# Patient Record
Sex: Male | Born: 1980 | Race: White | Hispanic: Yes | Marital: Single | Smoking: Current every day smoker
Health system: Southern US, Community
[De-identification: ages and names within clinical notes are randomized; demographics above are authoritative.]

## PROBLEM LIST (undated history)

## (undated) HISTORY — PX: ROTATOR CUFF REPAIR: SHX139

---

## 2004-05-11 ENCOUNTER — Emergency Department (HOSPITAL_COMMUNITY): Admission: EM | Admit: 2004-05-11 | Discharge: 2004-05-11 | Payer: Self-pay | Admitting: Emergency Medicine

## 2005-08-14 ENCOUNTER — Emergency Department (HOSPITAL_COMMUNITY): Admission: EM | Admit: 2005-08-14 | Discharge: 2005-08-14 | Payer: Self-pay | Admitting: Emergency Medicine

## 2005-12-25 ENCOUNTER — Emergency Department (HOSPITAL_COMMUNITY): Admission: EM | Admit: 2005-12-25 | Discharge: 2005-12-25 | Payer: Self-pay | Admitting: Emergency Medicine

## 2007-05-30 ENCOUNTER — Emergency Department (HOSPITAL_COMMUNITY): Admission: EM | Admit: 2007-05-30 | Discharge: 2007-05-30 | Payer: Self-pay | Admitting: Emergency Medicine

## 2007-06-12 ENCOUNTER — Ambulatory Visit (HOSPITAL_COMMUNITY): Admission: RE | Admit: 2007-06-12 | Discharge: 2007-06-12 | Payer: Self-pay | Admitting: *Deleted

## 2007-06-12 ENCOUNTER — Encounter (INDEPENDENT_AMBULATORY_CARE_PROVIDER_SITE_OTHER): Payer: Self-pay | Admitting: *Deleted

## 2010-10-02 NOTE — Op Note (Signed)
NAMESHERWOOD, CASTILLA       ACCOUNT NO.:  [id number]   MEDICAL RECORD NO.:  [id number]          PATIENT TYPE:  AMB   LOCATION:  ENDO                         FACILITY:  [facility]   PHYSICIAN:  Georgiana Spinner, M.D.    DATE OF BIRTH:  [birth year]   DATE OF PROCEDURE:  06/12/2007  DATE OF DISCHARGE:                               OPERATIVE REPORT   PROCEDURE:  Upper endoscopy.   INDICATIONS:  Abdominal pain, hematemesis, and weight loss.   ANESTHESIA:  Fentanyl 100 mcg, Versed 7 mg.   PROCEDURE:  With the patient mildly sedated in the left lateral  decubitus position the Pentax videoscopic endoscope was inserted in the  mouth, passed under direct vision through the esophagus which appeared  normal until we reached the distal esophagus and there were changes of  esophagitis and/or Barrett's.  Photographs and biopsies taken.  We  entered into the stomach.  Fundus, body, antrum appeared normal.  Duodenal bulb showed an ulcer. The second portion of the duodenum  appeared normal.   From this point the endoscope was slowly withdrawn taking  circumferential views of duodenal mucosa until the endoscope had been  pulled back into stomach, placed in retroflexion to view the stomach  from below.  The endoscope was straightened and withdrawn taking  circumferential views of the remaining gastric and esophageal mucosa.  The patient's vital signs and pulse oximeter remained stable.  The  patient tolerated the procedure well without apparent complications.   FINDINGS:  Ulcer of duodenum, small, with white base.  Biopsies taken  for H pylori from the antrum which appeared normal and esophagitis  and/or Barrett's esophagus.  Await biopsy reports.  The patient will  call me for results and follow-up with me as an outpatient.  We will  start the patient on b.i.d. PPI until seen in the office. We will also  suggest strongly the patient decrease his alcohol intake, ideally to  stop, but to limit it to  no more than two a day if possible.           ______________________________  Georgiana Spinner, M.D.     GMO/MEDQ  D:  06/12/2007  T:  06/12/2007  Job:  [id number]

## 2011-02-07 LAB — COMPREHENSIVE METABOLIC PANEL
ALT: 34
AST: 35
Albumin: 4.1
Alkaline Phosphatase: 64
BUN: 5 — ABNORMAL LOW
CO2: 24
Calcium: 8.8
Chloride: 110
Creatinine, Ser: 0.82
GFR calc Af Amer: >60
GFR calc non Af Amer: >60
Glucose, Bld: 87
Potassium: 4
Sodium: 144
Total Bilirubin: 0.4
Total Protein: 6.7

## 2011-02-07 LAB — URINALYSIS, ROUTINE W REFLEX MICROSCOPIC
Bilirubin Urine: NEGATIVE
Glucose, UA: NEGATIVE
Hgb urine dipstick: NEGATIVE
Ketones, ur: NEGATIVE
Nitrite: NEGATIVE
Protein, ur: NEGATIVE
Specific Gravity, Urine: 1.006
Urobilinogen, UA: 0.2
pH: 7

## 2011-02-07 LAB — DIFFERENTIAL
Basophils Absolute: 0
Basophils Relative: 0
Eosinophils Absolute: 0.1
Eosinophils Relative: 1
Lymphocytes Relative: 14
Lymphs Abs: 1.1
Monocytes Absolute: 0.7
Monocytes Relative: 8
Neutro Abs: 6.5
Neutrophils Relative %: 78 — ABNORMAL HIGH

## 2011-02-07 LAB — CBC
HCT: 44.9
Hemoglobin: 15.6
MCHC: 34.7
MCV: 91.2
Platelets: 301
RBC: 4.93
RDW: 12.3
WBC: 8.4

## 2011-02-07 LAB — LIPASE, BLOOD: Lipase: 27

## 2011-02-07 LAB — ETHANOL: Alcohol, Ethyl (B): 162 — ABNORMAL HIGH

## 2012-06-17 ENCOUNTER — Emergency Department (HOSPITAL_COMMUNITY): Payer: Self-pay

## 2012-06-17 ENCOUNTER — Emergency Department (HOSPITAL_COMMUNITY)
Admission: EM | Admit: 2012-06-17 | Discharge: 2012-06-17 | Disposition: A | Discharge status: Home / Self Care | Payer: Self-pay | Attending: Emergency Medicine | Admitting: Emergency Medicine

## 2012-06-17 ENCOUNTER — Encounter (HOSPITAL_COMMUNITY): Payer: Self-pay

## 2012-06-17 DIAGNOSIS — S6990XA Unspecified injury of unspecified wrist, hand and finger(s), initial encounter: Secondary | ICD-10-CM | POA: Insufficient documentation

## 2012-06-17 DIAGNOSIS — Y9289 Other specified places as the place of occurrence of the external cause: Secondary | ICD-10-CM | POA: Insufficient documentation

## 2012-06-17 DIAGNOSIS — M549 Dorsalgia, unspecified: Secondary | ICD-10-CM

## 2012-06-17 DIAGNOSIS — S8990XA Unspecified injury of unspecified lower leg, initial encounter: Secondary | ICD-10-CM | POA: Insufficient documentation

## 2012-06-17 DIAGNOSIS — Y9301 Activity, walking, marching and hiking: Secondary | ICD-10-CM | POA: Insufficient documentation

## 2012-06-17 DIAGNOSIS — R109 Unspecified abdominal pain: Secondary | ICD-10-CM

## 2012-06-17 DIAGNOSIS — IMO0002 Reserved for concepts with insufficient information to code with codable children: Secondary | ICD-10-CM | POA: Insufficient documentation

## 2012-06-17 DIAGNOSIS — S99929A Unspecified injury of unspecified foot, initial encounter: Secondary | ICD-10-CM | POA: Insufficient documentation

## 2012-06-17 DIAGNOSIS — S3981XA Other specified injuries of abdomen, initial encounter: Secondary | ICD-10-CM | POA: Insufficient documentation

## 2012-06-17 DIAGNOSIS — F17200 Nicotine dependence, unspecified, uncomplicated: Secondary | ICD-10-CM | POA: Insufficient documentation

## 2012-06-17 DIAGNOSIS — M79676 Pain in unspecified toe(s): Secondary | ICD-10-CM

## 2012-06-17 MED ORDER — HYDROCODONE-ACETAMINOPHEN 5-325 MG PO TABS: 1.0000 | ORAL_TABLET | Freq: Four times a day (QID) | ORAL | Status: DC | PRN

## 2012-06-17 MED ORDER — IOHEXOL 300 MG/ML  SOLN
100.0000 mL | Freq: Once | INTRAMUSCULAR | Status: AC | PRN
  Administered 2012-06-17: 100 mL via INTRAVENOUS

## 2012-06-17 MED ORDER — OXYCODONE-ACETAMINOPHEN 5-325 MG PO TABS
2.0000 | ORAL_TABLET | Freq: Once | ORAL | Status: AC
  Administered 2012-06-17: 2 via ORAL
  Filled 2012-06-17: qty 2

## 2012-06-17 MED ORDER — CYCLOBENZAPRINE HCL 10 MG PO TABS: 10.0000 mg | ORAL_TABLET | Freq: Two times a day (BID) | ORAL | Status: DC | PRN
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
## 2012-06-17 NOTE — ED Notes (Signed)
Pt discharge.Vital signs stable and GCS 15. 

## 2012-06-17 NOTE — ED Provider Notes (Signed)
Medical screening examination/treatment/procedure(s) were performed by non-physician practitioner and as supervising physician I was immediately available for consultation/collaboration.   Truly Stankiewicz, MD 06/17/12 2326 

## 2012-06-17 NOTE — ED Notes (Signed)
Report received from EMS. Unable to locate pt in the lobby to complete triage.

## 2012-06-17 NOTE — ED Notes (Signed)
PA stated to change acuity from a 4 to a 3.

## 2012-06-17 NOTE — ED Notes (Signed)
Patient left ribs pain achy sore 8/10 airway intact bilateral equal chest rise and fall even unlabored with right hand pain +1 swelling radial pulse +2 full sensation cap refill less then 2 seconds.  Right greater toe pain 8/10 achy throbbing light blue ecchymosis. Cap refill less then 2 seconds.

## 2012-12-03 ENCOUNTER — Encounter (HOSPITAL_COMMUNITY): Payer: Self-pay | Admitting: Emergency Medicine

## 2012-12-03 ENCOUNTER — Emergency Department (HOSPITAL_COMMUNITY)
Admission: EM | Admit: 2012-12-03 | Discharge: 2012-12-03 | Disposition: A | Discharge status: Home / Self Care | Payer: BC Managed Care – PPO | Attending: Emergency Medicine | Admitting: Emergency Medicine

## 2012-12-03 DIAGNOSIS — K047 Periapical abscess without sinus: Secondary | ICD-10-CM | POA: Insufficient documentation

## 2012-12-03 DIAGNOSIS — F17200 Nicotine dependence, unspecified, uncomplicated: Secondary | ICD-10-CM | POA: Insufficient documentation

## 2012-12-03 MED ORDER — HYDROCODONE-ACETAMINOPHEN 5-325 MG PO TABS: 1.0000 | ORAL_TABLET | ORAL | Status: AC | PRN

## 2012-12-03 MED ORDER — PENICILLIN V POTASSIUM 500 MG PO TABS: 500.0000 mg | ORAL_TABLET | Freq: Three times a day (TID) | ORAL | Status: AC

## 2012-12-03 MED ORDER — PENICILLIN V POTASSIUM 250 MG PO TABS
500.0000 mg | ORAL_TABLET | Freq: Four times a day (QID) | ORAL | Status: DC
  Administered 2012-12-03: 500 mg via ORAL
  Filled 2012-12-03: qty 2

## 2012-12-03 NOTE — ED Provider Notes (Signed)
History    CSN: [id number] Arrival date & time 12/03/12  0047  None    Chief Complaint  Patient presents with  . Dental Pain   (Consider location/radiation/quality/duration/timing/severity/associated sxs/prior Treatment) HPI History provided by pt.   Pt c/o severe L lower dental pain x 2 weeks.  Pain improved last week, but then worsened again yesterday.  Associated w/ edema of adjacent gingiva and buccal mucosa.   No improvement w/ tylenol and ibuprofen.  Denies trauma.  Does not have a dentist.  History reviewed. No pertinent past medical history. Past Surgical History  Procedure Laterality Date  . Rotator cuff repair     No family history on file. History  Substance Use Topics  . Smoking status: Current Every Day Smoker -- 0.50 packs/day  . Smokeless tobacco: Not on file  . Alcohol Use: Yes     Comment: 2-3 24 oz a day    Review of Systems  All other systems reviewed and are negative.    Allergies  Aspirin  Home Medications   Current Outpatient Rx  Name  Route  Sig  Dispense  Refill  . acetaminophen (TYLENOL) 325 MG tablet   Oral   Take 1,300 mg by mouth every 6 (six) hours as needed for pain.         Marland Kitchen ibuprofen (ADVIL,MOTRIN) 200 MG tablet   Oral   Take 800 mg by mouth every 6 (six) hours as needed for pain.         Marland Kitchen HYDROcodone-acetaminophen (NORCO/VICODIN) 5-325 MG per tablet   Oral   Take 1 tablet by mouth every 4 (four) hours as needed for pain.   20 tablet   0   . penicillin v potassium (VEETID) 500 MG tablet   Oral   Take 1 tablet (500 mg total) by mouth 3 (three) times daily.   29 tablet   0    BP 129/91  Pulse 105  Temp(Src) 98.8 F (37.1 C) (Oral)  Resp 18  SpO2 98% Physical Exam  Nursing note and vitals reviewed. Constitutional: He is oriented to person, place, and time. He appears well-developed and well-nourished.  HENT:  Head: Normocephalic and atraumatic. No trismus in the jaw.  Mouth/Throat: Uvula is midline and  oropharynx is clear and moist.  L lower second molar fractured and decayed posteriorly.  Ttp.  Adjacent gingiva and buccal mucosa edematous.  Mild induration of buccal mucosa.   Eyes:  Normal appearance  Neck: Normal range of motion. Neck supple.  No submandibular edema  Lymphadenopathy:    He has no cervical adenopathy.  Neurological: He is alert and oriented to person, place, and time.  Psychiatric: He has a normal mood and affect. His behavior is normal.    ED Course  Dental Date/Time: 12/03/2012 2:18 AM Performed by: Otilio Miu Authorized by: Ruby Cola E Consent: Verbal consent obtained. Risks and benefits: risks, benefits and alternatives were discussed Consent given by: patient Local anesthesia used: yes Anesthesia: local infiltration Local anesthetic: bupivacaine 0.5% without epinephrine Anesthetic total: 1.8 ml Patient sedated: no Patient tolerance: Patient tolerated the procedure well with no immediate complications. Comments: Local injection at L 2nd molar   (including critical care time)  Labs Reviewed - No data to display No results found. 1. Periapical abscess     MDM  32yo healthy M presents w/ likely periapical abscess L lower 2nd molar.  Treated w/ local anesthetic w/ temporary relief.  He received first dose of penicillin as well.  D/c'd home w/ penicillin and vicodin + referral to dentist.  Return precautions discussed.   Otilio Miu, PA-C 12/03/12 [phone]

## 2012-12-03 NOTE — ED Notes (Signed)
Per pt, pt began having toothache approx 2 weeks ago. Pt reports having swelling to left side of face with pain. Pt did not see anyone for treatment, but pt took 500mg  amoxicillin per day x6 days and states swelling went down. PT states that 3 or 4 days ago, the swelling started to come back, and today he bit down on a cracker and started having excruciating pain on left lower side of jaw.

## 2012-12-04 NOTE — ED Provider Notes (Signed)
Medical screening examination/treatment/procedure(s) were performed by non-physician practitioner and as supervising physician I was immediately available for consultation/collaboration.  Sunnie Nielsen, MD 12/04/12 [phone]

## 2019-01-14 ENCOUNTER — Other Ambulatory Visit: Payer: Self-pay

## 2019-01-14 DIAGNOSIS — Z20822 Contact with and (suspected) exposure to covid-19: Secondary | ICD-10-CM

## 2019-01-16 LAB — NOVEL CORONAVIRUS, NAA: SARS-CoV-2, NAA: NOT DETECTED

## 2019-01-18 ENCOUNTER — Telehealth: Payer: Self-pay | Admitting: General Practice

## 2019-01-18 NOTE — Telephone Encounter (Signed)
Patient called in and received his covid test result °

## 2019-03-27 ENCOUNTER — Encounter (HOSPITAL_COMMUNITY): Payer: Self-pay | Admitting: Emergency Medicine

## 2019-03-27 ENCOUNTER — Emergency Department (HOSPITAL_COMMUNITY): Payer: Self-pay

## 2019-03-27 ENCOUNTER — Emergency Department (HOSPITAL_COMMUNITY)
Admission: EM | Admit: 2019-03-27 | Discharge: 2019-03-27 | Disposition: A | Discharge status: Home / Self Care | Payer: Self-pay | Attending: Emergency Medicine | Admitting: Emergency Medicine

## 2019-03-27 ENCOUNTER — Other Ambulatory Visit: Payer: Self-pay

## 2019-03-27 DIAGNOSIS — F1990 Other psychoactive substance use, unspecified, uncomplicated: Secondary | ICD-10-CM | POA: Insufficient documentation

## 2019-03-27 DIAGNOSIS — F17210 Nicotine dependence, cigarettes, uncomplicated: Secondary | ICD-10-CM | POA: Insufficient documentation

## 2019-03-27 DIAGNOSIS — R110 Nausea: Secondary | ICD-10-CM | POA: Insufficient documentation

## 2019-03-27 DIAGNOSIS — F10221 Alcohol dependence with intoxication delirium: Secondary | ICD-10-CM | POA: Insufficient documentation

## 2019-03-27 LAB — HEPATIC FUNCTION PANEL
ALT: 75 U/L — ABNORMAL HIGH (ref 0–44)
ALT: 81 U/L — ABNORMAL HIGH (ref 0–44)
AST: 55 U/L — ABNORMAL HIGH (ref 15–41)
AST: 55 U/L — ABNORMAL HIGH (ref 15–41)
Albumin: 4.2 g/dL (ref 3.5–5.0)
Albumin: 4.1 g/dL (ref 3.5–5.0)
Alkaline Phosphatase: 75 U/L (ref 38–126)
Alkaline Phosphatase: 74 U/L (ref 38–126)
Bilirubin, Direct: <0.1 mg/dL (ref 0.0–0.2)
Bilirubin, Direct: 0.1 mg/dL (ref 0.0–0.2)
Indirect Bilirubin: 0.9 mg/dL (ref 0.3–0.9)
Total Bilirubin: 0.7 mg/dL (ref 0.3–1.2)
Total Bilirubin: 1 mg/dL (ref 0.3–1.2)
Total Protein: 7.3 g/dL (ref 6.5–8.1)
Total Protein: 7.3 g/dL (ref 6.5–8.1)

## 2019-03-27 LAB — BASIC METABOLIC PANEL
Anion gap: 13 (ref 5–15)
BUN: 7 mg/dL (ref 6–20)
CO2: 17 mmol/L — ABNORMAL LOW (ref 22–32)
Calcium: 8.7 mg/dL — ABNORMAL LOW (ref 8.9–10.3)
Chloride: 103 mmol/L (ref 98–111)
Creatinine, Ser: 0.88 mg/dL (ref 0.61–1.24)
GFR calc Af Amer: >60 mL/min (ref >60)
GFR calc non Af Amer: >60 mL/min (ref >60)
Glucose, Bld: 96 mg/dL (ref 70–99)
Potassium: 3.8 mmol/L (ref 3.5–5.1)
Sodium: 133 mmol/L — ABNORMAL LOW (ref 135–145)

## 2019-03-27 LAB — CBC
HCT: 50.3 % (ref 39.0–52.0)
Hemoglobin: 17.1 g/dL — ABNORMAL HIGH (ref 13.0–17.0)
MCH: 31.5 pg (ref 26.0–34.0)
MCHC: 34 g/dL (ref 30.0–36.0)
MCV: 92.6 fL (ref 80.0–100.0)
Platelets: 296 10*3/uL (ref 150–400)
RBC: 5.43 MIL/uL (ref 4.22–5.81)
RDW: 11.7 % (ref 11.5–15.5)
WBC: 7 10*3/uL (ref 4.0–10.5)
nRBC: 0 % (ref 0.0–0.2)

## 2019-03-27 LAB — RAPID URINE DRUG SCREEN, HOSP PERFORMED
Amphetamines: NOT DETECTED
Barbiturates: NOT DETECTED
Benzodiazepines: NOT DETECTED
Cocaine: NOT DETECTED
Opiates: NOT DETECTED
Tetrahydrocannabinol: NOT DETECTED

## 2019-03-27 LAB — TROPONIN I (HIGH SENSITIVITY): Troponin I (High Sensitivity): 7 ng/L (ref <18)

## 2019-03-27 LAB — ETHANOL: Alcohol, Ethyl (B): 280 mg/dL — ABNORMAL HIGH (ref <10)

## 2019-03-27 MED ORDER — SODIUM CHLORIDE 0.9% FLUSH: 3.0000 mL | Freq: Once | INTRAVENOUS | Status: DC

## 2019-03-27 MED ORDER — ONDANSETRON 4 MG PO TBDP
4.0000 mg | ORAL_TABLET | Freq: Once | ORAL | Status: AC
  Administered 2019-03-27: 21:00:00 4 mg via ORAL
  Filled 2019-03-27: qty 1
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
## 2019-03-27 NOTE — ED Triage Notes (Addendum)
Pt disoriented. States he is having shortness of breathe. Pt endorses possible Syncopal episodes? States he has nausea. Pt states he has vomited 3 times after episodes of coughing.  Pt states he has been having chest pain for 3 days. Pt eyes/sclera are red. PT acting like he is impaired w ETOH. Endorses drinking X2 40s per day. Pt rates chest pain 10/10.

## 2019-03-31 ENCOUNTER — Emergency Department (HOSPITAL_COMMUNITY): Admission: EM | Admit: 2019-03-31 | Discharge: 2019-03-31 | Discharge status: Against Medical Advice | Payer: Self-pay

## 2019-03-31 NOTE — ED Notes (Signed)
Asked this PT to take vital signs. He stated "Fuck you." Asked if he is refusing treatment. PT stated "fuck you, Yes I am." Will continue to monitor.

## 2019-03-31 NOTE — ED Triage Notes (Signed)
Pt here refusing vitals and blood work , cursing at staff slurring his words and smells of etoh

## 2021-06-09 IMAGING — DX DG CHEST 1V PORT
1 series · 1 of 1 positions shown · non-contrast
Comparison: Chest radiograph 05/30/2007

CLINICAL DATA: Shortness of breath, possible syncopal episodes

EXAM:
PORTABLE CHEST 1 VIEW

[chest ap]
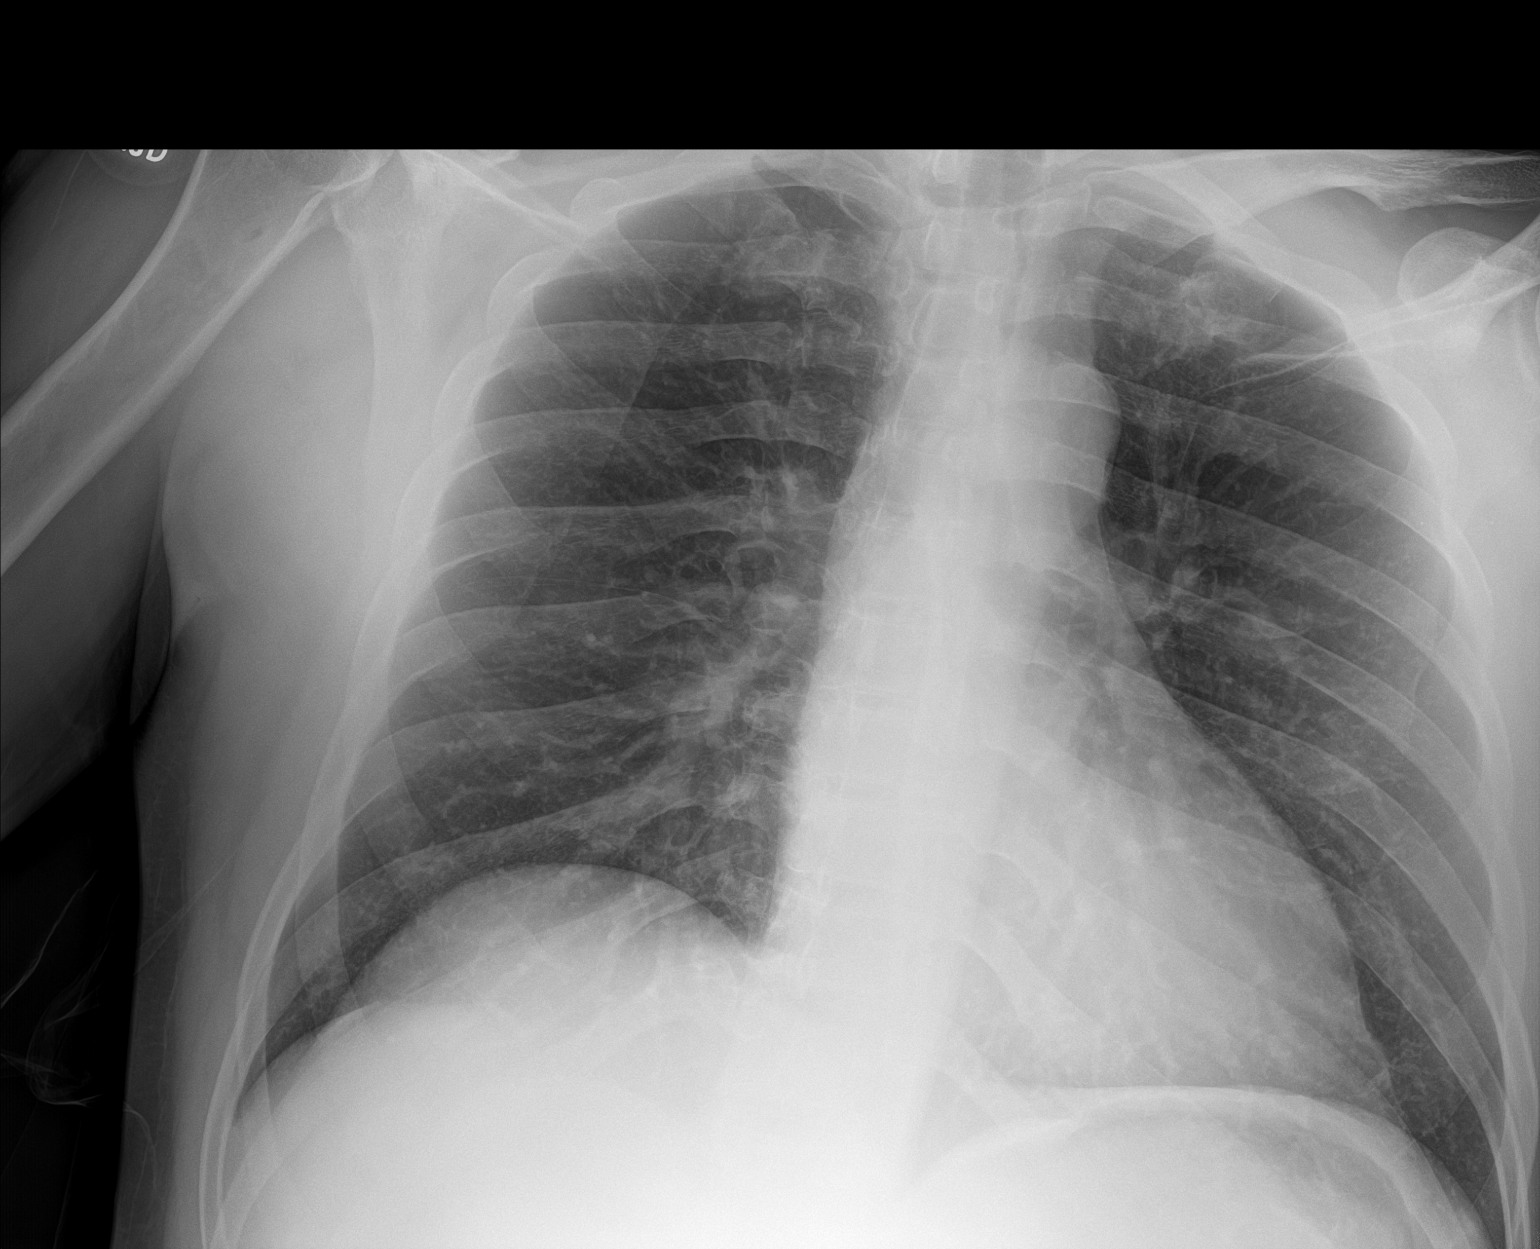

[1 of 1 positions shown; findings below may reference images not displayed]

FINDINGS: No consolidation, features of edema, pneumothorax, or effusion.
Pulmonary vascularity is normally distributed. The cardiomediastinal
contours are unremarkable. No acute osseous or soft tissue
abnormality.
IMPRESSION: No acute cardiopulmonary abnormality.
